# Patient Record
Sex: Female | Born: 1975 | Race: White | Hispanic: No | Marital: Married | Smoking: Never smoker
Health system: Southern US, Community
[De-identification: ages and names within clinical notes are randomized; demographics above are authoritative.]

## PROBLEM LIST (undated history)

## (undated) DIAGNOSIS — L309 Dermatitis, unspecified: Secondary | ICD-10-CM

## (undated) DIAGNOSIS — T7840XA Allergy, unspecified, initial encounter: Secondary | ICD-10-CM

## (undated) DIAGNOSIS — E785 Hyperlipidemia, unspecified: Secondary | ICD-10-CM

## (undated) DIAGNOSIS — R079 Chest pain, unspecified: Secondary | ICD-10-CM

## (undated) DIAGNOSIS — Z789 Other specified health status: Secondary | ICD-10-CM

## (undated) DIAGNOSIS — K219 Gastro-esophageal reflux disease without esophagitis: Secondary | ICD-10-CM

## (undated) HISTORY — DX: Dermatitis, unspecified: L30.9

## (undated) HISTORY — DX: Gastro-esophageal reflux disease without esophagitis: K21.9

## (undated) HISTORY — PX: WISDOM TOOTH EXTRACTION: SHX21

## (undated) HISTORY — PX: OTHER SURGICAL HISTORY: SHX169

## (undated) HISTORY — DX: Allergy, unspecified, initial encounter: T78.40XA

## (undated) HISTORY — DX: Chest pain, unspecified: R07.9

## (undated) HISTORY — DX: Hyperlipidemia, unspecified: E78.5

---

## 2003-10-21 ENCOUNTER — Other Ambulatory Visit: Admission: RE | Admit: 2003-10-21 | Discharge: 2003-10-21 | Payer: Self-pay | Admitting: Family Medicine

## 2004-06-22 ENCOUNTER — Other Ambulatory Visit: Admission: RE | Admit: 2004-06-22 | Discharge: 2004-06-22 | Payer: Self-pay | Admitting: Obstetrics and Gynecology

## 2004-11-13 ENCOUNTER — Other Ambulatory Visit: Admission: RE | Admit: 2004-11-13 | Discharge: 2004-11-13 | Payer: Self-pay | Admitting: Obstetrics and Gynecology

## 2005-11-15 ENCOUNTER — Other Ambulatory Visit: Admission: RE | Admit: 2005-11-15 | Discharge: 2005-11-15 | Payer: Self-pay | Admitting: Obstetrics and Gynecology

## 2006-12-02 ENCOUNTER — Other Ambulatory Visit: Admission: RE | Admit: 2006-12-02 | Discharge: 2006-12-02 | Payer: Self-pay | Admitting: Obstetrics and Gynecology

## 2007-12-24 ENCOUNTER — Inpatient Hospital Stay (HOSPITAL_COMMUNITY): Admission: AD | Admit: 2007-12-24 | Discharge: 2007-12-24 | Payer: Self-pay | Admitting: Obstetrics and Gynecology

## 2008-04-21 ENCOUNTER — Inpatient Hospital Stay (HOSPITAL_COMMUNITY): Admission: AD | Admit: 2008-04-21 | Discharge: 2008-04-21 | Payer: Self-pay | Admitting: Obstetrics and Gynecology

## 2008-07-19 ENCOUNTER — Inpatient Hospital Stay (HOSPITAL_COMMUNITY): Admission: AD | Admit: 2008-07-19 | Discharge: 2008-07-21 | Payer: Self-pay | Admitting: Obstetrics and Gynecology

## 2008-07-22 ENCOUNTER — Encounter: Admission: RE | Admit: 2008-07-22 | Discharge: 2008-08-21 | Payer: Self-pay | Admitting: Obstetrics and Gynecology

## 2008-08-22 ENCOUNTER — Encounter: Admission: RE | Admit: 2008-08-22 | Discharge: 2008-09-18 | Payer: Self-pay | Admitting: Obstetrics and Gynecology

## 2010-10-04 NOTE — L&D Delivery Note (Signed)
Delivery Note  Complete dilation at 0716 Onset of pushing at 0725 FHR second stage 125-130 with doppler  Anesthesia: none  Delivery of a viable female at 50 by CNM  Nuchal Cord x1 tight reduced down body at birth. Cord double clamped after cessation of pulsation, cut by FOB.  Cord blood sample collected. Collection of cord blood donation- not requested.   Placenta delivered shultz intact with 3 VC at 0757.  Placenta to L&D for disposal. Uterine tone firm bleeding scant  no laceration identified.   Est. Blood Loss (mL):39ml  Complications: none  Mom to postpartum.  Baby to Mom.  BAILEY,TANYA 07/08/2011, 8:06 AM

## 2010-11-23 LAB — HIV ANTIBODY (ROUTINE TESTING W REFLEX): HIV: NONREACTIVE

## 2010-11-23 LAB — RUBELLA ANTIBODY, IGM: Rubella: UNDETERMINED

## 2010-11-23 LAB — ABO/RH

## 2011-02-16 NOTE — H&P (Signed)
Haley Guzman, Haley Guzman NO.:  [id number]   MEDICAL RECORD NO.:  [id number]          PATIENT TYPE:  MAT   LOCATION:  [facility]                          FACILITY:  [facility]   PHYSICIAN:  Hal Morales, M.D.DATE OF BIRTH:  1976/07/21   DATE OF ADMISSION:  07/19/2008  DATE OF DISCHARGE:                              HISTORY & PHYSICAL   Dr. Dierdre Forth is the attending physician.   Haley Guzman is a 35 year old gravida 1, para 0 at 40-2/7 weeks who  presented complaining of uterine contractions every 3 minutes since  approximately 5:00 a.m. No leaking or bleeding.  She reports positive  fetal movement.  Pregnancy has been remarkable for:  1. Rh negative.  2. Group B strep negative.  3. Short stature.  4. Rubella equivocal.  5. History of abnormal Pap with colposcopy.  6. First trimester spotting.   PRENATAL LABS:  Blood type is A negative, Rh antibody negative, GTT  nonreactive. Rubella titer is equivocal.  Hepatitis B surface antigen  negative, HIV nonreactive. GC and Chlamydia cultures were negative in  February.  Pap was normal in March. Hemoglobin upon entering the  practice was 12.4. It was within normal limits at 28 weeks.      She had a normal first trimester screening. She declined AFP.  She had  an elevated 1-hour Glucola but had a normal 3-hour GTT. Group B strep  culture was negative at 36 weeks.   HISTORY OF PRESENT PREGNANCY:  The patient entered care at approximately  9 weeks.  She was seen on November 22, 2007 for spotting and has had  none since. She did not receive Rhophylac at that time. Antibody screen  was done at the first visit and was negative. And then she received  Rhophylac at that time. She had an ultrasound at her first visit with  confirmatory dating of July 17, 2008.  First trimester screen was  normal.  She had an ultrasound at 17 weeks showing normal growth and  normal anatomy.  She had some left upper quadrant pain at 22 weeks.  She  had it before pregnancy but had a negative workup and is less frequent  now with pregnancy.  Glucola was given at 26 weeks. It was elevated at  152. Her 3-hour GTT was normal.  She had some Protonix for some reflux.  She had Rhophylac at 28 weeks.  Group B strep culture was negative.  She  had some  hemorrhoid pain at 37-38 weeks that was treated with comfort  measures.  Yesterday she was 1 cm, 90% vertex at minus 2 station.   OBSTETRICAL HISTORY:  The patient is primigravida.   MEDICAL HISTORY:  She is on oral contraceptives until January of 2009.  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  She is a  previous smoker on and off for 5 years but stopped with the onset of  pregnancy.  She had fractured her right ankle at age 1, fractured  collar bone at age 66. Wisdom teeth removed in 1996.   ALLERGIES:  She has no known  medication allergies.   FAMILY HISTORY:  Paternal grandfather had heart disease.  Maternal  grandfather and father have chronic hypertension. Her mother has asthma.  Paternal grandfather had a stroke.  Paternal grandmother had some type  of cancer.  Genetic history is remarkable father of baby's uncle and  first cousin being cousins.  The father of baby has a twin sister.   SOCIAL HISTORY:  The patient is married to the father of baby.  He is  involved and supportive.  His name is Tremeka Helbling.  The patient is  college educated.  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  She has been followed by the certified nurse midwife  service [facility].  She denies any alcohol, drug or tobacco  use during this pregnancy.   PHYSICAL EXAM:  VITAL SIGNS: Stable.  The patient is afebrile.  HEENT: Within normal limits.  LUNGS:  Breath sounds are clear.  HEART:  Regular rate and rhythm without murmur.  BREASTS:  Soft and nontender.  ABDOMEN:  Fundal height is approximately 38 cm.  GENITOURINARY:  Estimated fetal weight 7-8 pounds.   Uterine contractions  every 2 minutes, moderate quality.  Cervix is completely dilated, 100%  effaced, vertex at minus 1 station with bulging bag of water.  Fetal  heart rate is reactive with no decelerations.  EXTREMITIES:  Deep tendon reflexes are 2+ without clonus.  There is  trace edema noted.   IMPRESSION:  1. Intrauterine pregnancy at 40-2/7 weeks.  2. Second stage labor without any urge to push.  3. Reactive fetal heart rate tracing.  4. Negative group B strep.  5. Rhesus blood factor negative.   PLAN:  1. Admit to birthing suite per consult with Dr. Dierdre Forth as      attending physician.  2. Routine certified nurse midwife orders.  3. The patient desires epidural, but I advised her that she this may      not be able to be accomplished due to her advanced labor. May      consider giving a small dose of Stadol for patient comfort.      Renaldo Reel Emilee Hero, C.N.M.      Hal Morales, M.D.  Electronically Signed    VLL/MEDQ  D:  07/19/2008  T:  07/19/2008  Job:  [id number]

## 2011-06-09 LAB — STREP B DNA PROBE: GBS: POSITIVE

## 2011-06-28 LAB — RH IMMUNE GLOBULIN WORKUP (NOT WOMEN'S HOSP)
ABO/RH(D): A NEG
Antibody Screen: NEGATIVE

## 2011-07-02 LAB — RH IMMUNE GLOBULIN WORKUP (NOT WOMEN'S HOSP): Antibody Screen: NEGATIVE

## 2011-07-05 LAB — CBC
HCT: 36.1
Hemoglobin: 8.9 — ABNORMAL LOW
MCHC: 33.1
MCV: 87.7
Platelets: 279
RDW: 14.2
RDW: 14.5
WBC: 13.6 — ABNORMAL HIGH

## 2011-07-05 LAB — RH IMMUNE GLOB WKUP(>/=20WKS)(NOT WOMEN'S HOSP): Fetal Screen: NEGATIVE

## 2011-07-05 LAB — RPR: RPR Ser Ql: NONREACTIVE

## 2011-07-08 ENCOUNTER — Inpatient Hospital Stay (HOSPITAL_COMMUNITY)
Admission: AD | Admit: 2011-07-08 | Discharge: 2011-07-10 | DRG: 775 | Disposition: A | Discharge status: Home / Self Care | Payer: 59 | Source: Ambulatory Visit | Attending: Obstetrics | Admitting: Obstetrics

## 2011-07-08 ENCOUNTER — Encounter (HOSPITAL_COMMUNITY): Payer: Self-pay | Admitting: *Deleted

## 2011-07-08 DIAGNOSIS — D62 Acute posthemorrhagic anemia: Secondary | ICD-10-CM | POA: Diagnosis not present

## 2011-07-08 DIAGNOSIS — O9903 Anemia complicating the puerperium: Secondary | ICD-10-CM | POA: Diagnosis not present

## 2011-07-08 DIAGNOSIS — K644 Residual hemorrhoidal skin tags: Secondary | ICD-10-CM | POA: Diagnosis present

## 2011-07-08 DIAGNOSIS — O878 Other venous complications in the puerperium: Secondary | ICD-10-CM | POA: Diagnosis present

## 2011-07-08 HISTORY — DX: Other specified health status: Z78.9

## 2011-07-08 LAB — CBC
HCT: 37.5 % (ref 36.0–46.0)
Hemoglobin: 12.5 g/dL (ref 12.0–15.0)
MCH: 30.3 pg (ref 26.0–34.0)
MCHC: 33.3 g/dL (ref 30.0–36.0)
MCV: 90.8 fL (ref 78.0–100.0)
Platelets: 260 10*3/uL (ref 150–400)
RBC: 4.13 MIL/uL (ref 3.87–5.11)
RDW: 13.7 % (ref 11.5–15.5)
WBC: 11.4 10*3/uL — ABNORMAL HIGH (ref 4.0–10.5)

## 2011-07-08 LAB — RPR: RPR Ser Ql: NONREACTIVE

## 2011-07-08 MED ORDER — DEXTROSE 5 % IV SOLN: 2.5000 10*6.[IU] | INTRAVENOUS | Status: DC

## 2011-07-08 MED ORDER — BUTORPHANOL TARTRATE 2 MG/ML IJ SOLN
1.0000 mg | Freq: Once | INTRAMUSCULAR | Status: AC
  Administered 2011-07-08: 1 mg via INTRAVENOUS
  Filled 2011-07-08: qty 1

## 2011-07-08 MED ORDER — ACETAMINOPHEN 325 MG PO TABS: 650.0000 mg | ORAL_TABLET | ORAL | Status: DC | PRN

## 2011-07-08 MED ORDER — PENICILLIN G POTASSIUM 5000000 UNITS IJ SOLR: 5.0000 10*6.[IU] | Freq: Once | INTRAVENOUS | Status: DC

## 2011-07-08 MED ORDER — IBUPROFEN 600 MG PO TABS: 600.0000 mg | ORAL_TABLET | Freq: Four times a day (QID) | ORAL | Status: DC | PRN

## 2011-07-08 MED ORDER — BELLADONNA ALKALOIDS-OPIUM 16.2-60 MG RE SUPP
1.0000 | Freq: Once | RECTAL | Status: AC
  Administered 2011-07-08: 1 via RECTAL

## 2011-07-08 MED ORDER — CITRIC ACID-SODIUM CITRATE 334-500 MG/5ML PO SOLN: 30.0000 mL | ORAL | Status: DC | PRN

## 2011-07-08 MED ORDER — BENZOCAINE-MENTHOL 20-0.5 % EX AERO
INHALATION_SPRAY | CUTANEOUS | Status: AC
  Filled 2011-07-08: qty 56

## 2011-07-08 MED ORDER — SODIUM CHLORIDE 0.9 % IV SOLN: 250.0000 mL | INTRAVENOUS | Status: DC

## 2011-07-08 MED ORDER — FLEET ENEMA 7-19 GM/118ML RE ENEM: 1.0000 | ENEMA | RECTAL | Status: DC | PRN

## 2011-07-08 MED ORDER — LANOLIN HYDROUS EX OINT: TOPICAL_OINTMENT | CUTANEOUS | Status: DC | PRN

## 2011-07-08 MED ORDER — ONDANSETRON HCL 4 MG/2ML IJ SOLN: 4.0000 mg | Freq: Four times a day (QID) | INTRAMUSCULAR | Status: DC | PRN

## 2011-07-08 MED ORDER — PENICILLIN G POTASSIUM 5000000 UNITS IJ SOLR
2.5000 10*6.[IU] | INTRAVENOUS | Status: DC
  Filled 2011-07-08 (×2): qty 2.5

## 2011-07-08 MED ORDER — PENICILLIN G POTASSIUM 5000000 UNITS IJ SOLR
2.5000 10*6.[IU] | INTRAMUSCULAR | Status: DC
  Filled 2011-07-08 (×2): qty 2.5

## 2011-07-08 MED ORDER — BENZOCAINE-MENTHOL 20-0.5 % EX AERO
1.0000 "application " | INHALATION_SPRAY | CUTANEOUS | Status: DC | PRN
  Administered 2011-07-08: 1 via TOPICAL

## 2011-07-08 MED ORDER — PENICILLIN G POTASSIUM 5000000 UNITS IJ SOLR
5.0000 10*6.[IU] | Freq: Once | INTRAVENOUS | Status: AC
  Administered 2011-07-08: 5 10*6.[IU] via INTRAVENOUS
  Filled 2011-07-08: qty 5

## 2011-07-08 MED ORDER — IBUPROFEN 600 MG PO TABS
600.0000 mg | ORAL_TABLET | Freq: Four times a day (QID) | ORAL | Status: DC
  Administered 2011-07-08 – 2011-07-10 (×8): 600 mg via ORAL
  Filled 2011-07-08 (×8): qty 1

## 2011-07-08 MED ORDER — DOCUSATE SODIUM 100 MG PO CAPS
100.0000 mg | ORAL_CAPSULE | Freq: Two times a day (BID) | ORAL | Status: DC
  Administered 2011-07-08 – 2011-07-10 (×5): 100 mg via ORAL
  Filled 2011-07-08 (×5): qty 1

## 2011-07-08 MED ORDER — LIDOCAINE HCL (PF) 1 % IJ SOLN: 30.0000 mL | INTRAMUSCULAR | Status: DC | PRN

## 2011-07-08 MED ORDER — LIDOCAINE HCL (PF) 1 % IJ SOLN
30.0000 mL | INTRAMUSCULAR | Status: DC | PRN
  Filled 2011-07-08 (×2): qty 30

## 2011-07-08 MED ORDER — OXYCODONE-ACETAMINOPHEN 5-325 MG PO TABS: 1.0000 | ORAL_TABLET | Freq: Four times a day (QID) | ORAL | Status: DC | PRN

## 2011-07-08 MED ORDER — SODIUM CHLORIDE 0.9 % IJ SOLN: 3.0000 mL | INTRAMUSCULAR | Status: DC | PRN

## 2011-07-08 MED ORDER — HYDROCORTISONE 2.5 % RE CREA
1.0000 "application " | TOPICAL_CREAM | Freq: Two times a day (BID) | RECTAL | Status: DC
  Administered 2011-07-08 – 2011-07-10 (×3): 1 via RECTAL
  Filled 2011-07-08 (×2): qty 28.35

## 2011-07-08 MED ORDER — OXYTOCIN 20 UNITS IN LACTATED RINGERS INFUSION - SIMPLE
125.0000 mL/h | Freq: Once | INTRAVENOUS | Status: AC
  Administered 2011-07-08: 500 mL/h via INTRAVENOUS

## 2011-07-08 MED ORDER — DIPHENHYDRAMINE HCL 25 MG PO CAPS: 25.0000 mg | ORAL_CAPSULE | Freq: Four times a day (QID) | ORAL | Status: DC | PRN

## 2011-07-08 MED ORDER — OXYTOCIN BOLUS FROM INFUSION
500.0000 mL | Freq: Once | INTRAVENOUS | Status: DC
  Filled 2011-07-08: qty 1000
  Filled 2011-07-08: qty 500

## 2011-07-08 MED ORDER — WITCH HAZEL-GLYCERIN EX PADS
1.0000 "application " | MEDICATED_PAD | CUTANEOUS | Status: DC | PRN
  Administered 2011-07-08: 1 via TOPICAL

## 2011-07-08 MED ORDER — LACTATED RINGERS IV SOLN
INTRAVENOUS | Status: DC
  Administered 2011-07-08: 05:00:00 via INTRAVENOUS

## 2011-07-08 MED ORDER — PANTOPRAZOLE SODIUM 40 MG PO TBEC
80.0000 mg | DELAYED_RELEASE_TABLET | Freq: Every day | ORAL | Status: DC
  Administered 2011-07-08: 80 mg via ORAL
  Filled 2011-07-08 (×3): qty 2

## 2011-07-08 MED ORDER — DIBUCAINE 1 % RE OINT: 1.0000 "application " | TOPICAL_OINTMENT | RECTAL | Status: DC | PRN

## 2011-07-08 MED ORDER — OXYCODONE-ACETAMINOPHEN 5-325 MG PO TABS: 2.0000 | ORAL_TABLET | ORAL | Status: DC | PRN

## 2011-07-08 MED ORDER — SODIUM CHLORIDE 0.9 % IJ SOLN: 3.0000 mL | Freq: Two times a day (BID) | INTRAMUSCULAR | Status: DC

## 2011-07-08 MED ORDER — PRENATAL PLUS 27-1 MG PO TABS
1.0000 | ORAL_TABLET | Freq: Every day | ORAL | Status: DC
  Administered 2011-07-08 – 2011-07-10 (×3): 1 via ORAL
  Filled 2011-07-08 (×3): qty 1

## 2011-07-08 MED ORDER — LACTATED RINGERS IV SOLN
500.0000 mL | INTRAVENOUS | Status: DC | PRN
Start: 2011-07-08 — End: 2011-07-08

## 2011-07-08 MED ORDER — SIMETHICONE 80 MG PO CHEW: 80.0000 mg | CHEWABLE_TABLET | ORAL | Status: DC | PRN

## 2011-07-08 NOTE — Progress Notes (Signed)
Haley Guzman notified of pt presenting for labor check.  Notified of VE. Admit orders received.

## 2011-07-08 NOTE — Progress Notes (Signed)
Will call when pt can be transferred to birthing suites.

## 2011-07-08 NOTE — Progress Notes (Signed)
Pt may go to room 169 in .

## 2011-07-08 NOTE — Progress Notes (Signed)
Contractions,  

## 2011-07-08 NOTE — Progress Notes (Signed)
Pt presents to mau for labor check.  efm and toco placed, assessing.  Ctx started around 1am.  Denies leaking or bleeding.

## 2011-07-08 NOTE — H&P (Signed)
Haley Guzman is a 35 y.o. female presenting for onset of labor. Maternal Medical History:  Reason for admission: Reason for admission: contractions.  Contractions: Onset was 3-5 hours ago.   Frequency: regular.   Duration is approximately 3 minutes.   Perceived severity is strong.    Fetal activity: Perceived fetal activity is normal.   Last perceived fetal movement was within the past hour.    Prenatal complications: No bleeding.     OB History    Grav Para Term Preterm Abortions TAB SAB Ect Mult Living   2 1 1  0 0 0 0 0 0 1     Past Medical History  Diagnosis Date  . No pertinent past medical history    Past Surgical History  Procedure Date  . Wisdom tooth extraction    Family History: family history is not on file. Social History:  reports that she has never smoked. She does not have any smokeless tobacco history on file. She reports that she does not drink alcohol or use illicit drugs.  Review of Systems  Constitutional: Negative for fever.  Skin: Negative for rash.    Dilation: 5.5 Effacement (%): 90;100 Station: -1 (BB) Exam by:: Humphrey Rolls, RN Blood pressure 128/86, pulse 93, temperature 98.2 F (36.8 C), resp. rate 18, height 5' (1.524 m), weight 77.111 kg (170 lb). Maternal Exam:  Uterine Assessment: Contraction strength is firm.  Contraction duration is 3 minutes. Contraction frequency is regular.   Abdomen: Patient reports no abdominal tenderness. Estimated fetal weight is 8 pounds.   Fetal presentation: vertex  Introitus: Normal vulva. Normal vagina.  Pelvis: adequate for delivery.      Fetal Exam Fetal Monitor Review: Baseline rate: 135.  Variability: moderate (6-25 bpm).   Pattern: accelerations present.    Fetal State Assessment: Category I - tracings are normal.     Physical Exam  Constitutional: She appears well-developed and well-nourished.  HENT:  Head: Normocephalic.  Neck: Normal range of motion.  Cardiovascular: Normal rate.    Respiratory: Effort normal.  Genitourinary: Vagina normal and uterus normal.  Musculoskeletal: Normal range of motion.  Skin: Skin is warm and dry.  Psychiatric: She has a normal mood and affect.    Prenatal labs: ABO, Rh: A/Negative/-- (02/20 0000) Antibody:  negative Rubella: Equivocal (02/20 0000) RPR:   NR HBsAg: Negative (02/20 0000)  HIV: Non-reactive (02/20 0000)  GBS: Positive (09/05 0000)   Assessment/Plan: Active labor w/ hx precipitous delivery + GBS  1) admit 2) PCN protocol 3) AROM after PPCN dose  Aniza Shor 07/08/2011, 5:56 AM

## 2011-07-08 NOTE — Progress Notes (Signed)
  S: Feeling better since stadol in MAU     Tolerating contractions well - breathing with ctx / no pressure with ctx   O:  Calm and breathing thru ctx        PNC initial dose infused        VS: Blood pressure 113/65, pulse 65, temperature 98.3 F (36.8 C), temperature source Oral, resp. rate 20, height 5' (1.524 m), weight 77.111 kg (170 lb).        FHR : baseline 135 / variability moderate / accels + (10x10 since stadol) / decels none        EFM: Category 1        Toco: contractions every 3 minutes / moderate         Cervix : 8/100%/0 station /BBOW        Membranes: AROM - clear / vtx ROT        EFW 8-10  A: Active labor      P: Expectant management      PCN protocol intrapartum      Rotate maternal position to facilitate fetal rotation & descent      Anticipate SVB     Haley Guzman 07/08/2011, 6:31 AM

## 2011-07-08 NOTE — Progress Notes (Signed)
Pt to room 169 via wheelchair. 

## 2011-07-09 LAB — CBC
HCT: 33.6 % — ABNORMAL LOW (ref 36.0–46.0)
Hemoglobin: 10.7 g/dL — ABNORMAL LOW (ref 12.0–15.0)
MCH: 29.4 pg (ref 26.0–34.0)
MCHC: 31.8 g/dL (ref 30.0–36.0)
MCV: 92.3 fL (ref 78.0–100.0)
Platelets: 198 10*3/uL (ref 150–400)
RBC: 3.64 MIL/uL — ABNORMAL LOW (ref 3.87–5.11)
RDW: 13.9 % (ref 11.5–15.5)
WBC: 12.2 10*3/uL — ABNORMAL HIGH (ref 4.0–10.5)

## 2011-07-09 MED ORDER — RHO D IMMUNE GLOBULIN 1500 UNIT/2ML IJ SOLN
300.0000 ug | Freq: Once | INTRAMUSCULAR | Status: AC
  Administered 2011-07-09: 300 ug via INTRAMUSCULAR
  Filled 2011-07-09: qty 2

## 2011-07-09 NOTE — Progress Notes (Signed)
  PPD # 1  Subjective: Pt reports feeling well/ Pain controlled with prescription NSAID's including ibuprofen (Motrin) Tolerating po/ Voiding without problems/ No n/v Bleeding is light Newborn info:  Information for the patient's newborn:  Roderica, Cathell [[id number]]  female  / circ completed this am by Dr Cousins/ Feeding: breast    Objective:  VS: Blood pressure 109/73, pulse 77, temperature 97.4 F (36.3 C), temperature source Oral, resp. rate 18, height 5' (1.524 m), weight 77.111 kg (170 lb), unknown if currently breastfeeding.    Basename 07/09/11 0550 07/08/11 0530  WBC 12.2* 11.4*  HGB 10.7* 12.5  HCT 33.6* 37.5  PLT 198 260    Blood type: --/--/A NEG (10/05 0550) Rubella: Equivocal (02/20 0000)    Physical Exam:  General: alert, cooperative and no distress CV: Regular rate and rhythm Resp: clear Abdomen: soft, nontender, normal bowel sounds Uterine Fundus: firm, below umbilicus, nontender Perineum: is normal and intact/ Rectum: sm hemorrhoids, approx 1cm, pink and not thrombosed Lochia: minimal Ext: edema trace and Homans sign is negative, no sign of DVT   A/P: PPD # 1/ G2P2 0 0 2 Doing well Continue routine post partum orders Anticipate discharge home in AM

## 2011-07-09 NOTE — Progress Notes (Signed)
UR chart review completed.  

## 2011-07-10 LAB — RH IG WORKUP (INCLUDES ABO/RH)
ABO/RH(D): A NEG
Antibody Screen: NEGATIVE
Fetal Screen: NEGATIVE
Gestational Age(Wks): 39

## 2011-07-10 MED ORDER — IBUPROFEN 600 MG PO TABS: 600.0000 mg | ORAL_TABLET | Freq: Four times a day (QID) | ORAL | Status: AC

## 2011-07-10 MED ORDER — DSS 100 MG PO CAPS: 100.0000 mg | ORAL_CAPSULE | Freq: Two times a day (BID) | ORAL | Status: AC

## 2011-07-10 NOTE — Progress Notes (Signed)
PPD 2 SVD  S:  Reports feeling well but a little sore all over. Slight discomfort from external hemorrhoids and   Uterine cramping when nursing. Anticipating d/c home today.              Tolerating po/ No nausea or vomiting             Bleeding is light             Pain controlled withibuprofen (OTC)             Up ad lib / ambulatory  Newborn breast feeding  / Circumcision completed   O:  A & O x 3, NAD, Pleasant affect             VS: Blood pressure 112/73, pulse 80, temperature 97.5 F (36.4 C), temperature source Oral, resp. rate 18, height 5' (1.524 m), weight 77.111 kg (170 lb), unknown if currently breastfeeding.  LABS: Lab Results  Component Value Date   WBC 12.2* 07/09/2011   HGB 10.7* 07/09/2011   HCT 33.6* 07/09/2011   MCV 92.3 07/09/2011   PLT 198 07/09/2011         Perineum:intact   Lochia: scant rubra, no clots  Extremities: no edema, no calf pain or tenderness  Anus: small, pink external hemorrhoids; no edema or evidence of thrombosis    A: PPD # 2   Doing well - stable status  Breastfeeding independently   External hemorrhoids, non-inflamed and resolving   P:        Routine post partum orders  DC home this AM  DC teaching per [employer] booklet  Continue hemorrhoid topical tx and colace.  Juanetta Beets SCNM BAILEY,TANYA 07/10/2011, 10:15 AM

## 2011-07-10 NOTE — Discharge Summary (Signed)
Obstetric Discharge Summary Reason for Admission: onset of labor Prenatal Procedures: none Intrapartum Procedures: spontaneous vaginal delivery Postpartum Procedures: none Complications-Operative and Postpartum: none Hemoglobin  Date Value Range Status  07/09/2011 10.7* 12.0-15.0 (g/dL) Final     HCT  Date Value Range Status  07/09/2011 33.6* 36.0-46.0 (%) Final    Discharge Diagnoses: Term Pregnancy-delivered / external hemorrhoids / mild acute blood loss anemia - stable  Discharge Information: Date: 07/10/2011 Activity: pelvic rest Diet: routine Medications: PNV, Ibuprofen, Colace and Nexium and Proctocream HC Condition: stable Instructions: refer to practice specific booklet Discharge to: home Follow-up Information    Follow up with Donne Baley. Make an appointment in 6 weeks.   Contact information:   [address] [place] [address] [phone]          Newborn Data: Live born female  Birth Weight: 8 lb 10 oz (3912 g) APGAR: ,   Home with mother.  Haley Guzman 07/10/2011, 10:23 AM

## 2011-07-15 ENCOUNTER — Encounter (HOSPITAL_COMMUNITY): Payer: Self-pay | Admitting: *Deleted

## 2014-03-26 ENCOUNTER — Encounter: Payer: Self-pay | Admitting: *Deleted

## 2014-08-05 ENCOUNTER — Encounter: Payer: Self-pay | Admitting: *Deleted

## 2018-03-10 DIAGNOSIS — Z01419 Encounter for gynecological examination (general) (routine) without abnormal findings: Secondary | ICD-10-CM | POA: Diagnosis not present

## 2018-03-10 DIAGNOSIS — Z130 Encounter for screening for diseases of the blood and blood-forming organs and certain disorders involving the immune mechanism: Secondary | ICD-10-CM | POA: Diagnosis not present

## 2018-03-10 DIAGNOSIS — Z6830 Body mass index (BMI) 30.0-30.9, adult: Secondary | ICD-10-CM | POA: Diagnosis not present

## 2018-03-10 DIAGNOSIS — Z1151 Encounter for screening for human papillomavirus (HPV): Secondary | ICD-10-CM | POA: Diagnosis not present

## 2018-03-10 DIAGNOSIS — Z131 Encounter for screening for diabetes mellitus: Secondary | ICD-10-CM | POA: Diagnosis not present

## 2018-03-10 DIAGNOSIS — Z1231 Encounter for screening mammogram for malignant neoplasm of breast: Secondary | ICD-10-CM | POA: Diagnosis not present

## 2018-03-10 DIAGNOSIS — Z1329 Encounter for screening for other suspected endocrine disorder: Secondary | ICD-10-CM | POA: Diagnosis not present

## 2018-03-28 DIAGNOSIS — M940 Chondrocostal junction syndrome [Tietze]: Secondary | ICD-10-CM | POA: Diagnosis not present

## 2018-03-28 DIAGNOSIS — Z800 Family history of malignant neoplasm of digestive organs: Secondary | ICD-10-CM | POA: Diagnosis not present

## 2018-03-28 DIAGNOSIS — K219 Gastro-esophageal reflux disease without esophagitis: Secondary | ICD-10-CM | POA: Diagnosis not present

## 2018-07-24 ENCOUNTER — Other Ambulatory Visit: Payer: Self-pay | Admitting: Physician Assistant

## 2018-07-24 DIAGNOSIS — K219 Gastro-esophageal reflux disease without esophagitis: Secondary | ICD-10-CM | POA: Diagnosis not present

## 2018-07-24 DIAGNOSIS — Z79899 Other long term (current) drug therapy: Secondary | ICD-10-CM | POA: Diagnosis not present

## 2018-07-24 DIAGNOSIS — R1013 Epigastric pain: Secondary | ICD-10-CM

## 2018-07-24 DIAGNOSIS — Z800 Family history of malignant neoplasm of digestive organs: Secondary | ICD-10-CM

## 2018-08-01 ENCOUNTER — Other Ambulatory Visit: Payer: Self-pay

## 2018-08-04 ENCOUNTER — Ambulatory Visit
Admission: RE | Admit: 2018-08-04 | Discharge: 2018-08-04 | Disposition: A | Discharge status: Home / Self Care | Payer: BLUE CROSS/BLUE SHIELD | Source: Ambulatory Visit | Attending: Physician Assistant | Admitting: Physician Assistant

## 2018-08-04 DIAGNOSIS — Z800 Family history of malignant neoplasm of digestive organs: Secondary | ICD-10-CM

## 2018-08-04 DIAGNOSIS — N2889 Other specified disorders of kidney and ureter: Secondary | ICD-10-CM | POA: Diagnosis not present

## 2018-08-04 DIAGNOSIS — R1013 Epigastric pain: Secondary | ICD-10-CM

## 2018-08-10 DIAGNOSIS — K317 Polyp of stomach and duodenum: Secondary | ICD-10-CM | POA: Diagnosis not present

## 2018-08-10 DIAGNOSIS — K219 Gastro-esophageal reflux disease without esophagitis: Secondary | ICD-10-CM | POA: Diagnosis not present

## 2018-08-10 DIAGNOSIS — K449 Diaphragmatic hernia without obstruction or gangrene: Secondary | ICD-10-CM | POA: Diagnosis not present

## 2018-08-15 DIAGNOSIS — K317 Polyp of stomach and duodenum: Secondary | ICD-10-CM | POA: Diagnosis not present

## 2018-08-21 ENCOUNTER — Other Ambulatory Visit: Payer: Self-pay | Admitting: Physician Assistant

## 2018-08-21 DIAGNOSIS — R935 Abnormal findings on diagnostic imaging of other abdominal regions, including retroperitoneum: Secondary | ICD-10-CM

## 2018-08-21 DIAGNOSIS — K769 Liver disease, unspecified: Secondary | ICD-10-CM

## 2018-08-25 DIAGNOSIS — Z23 Encounter for immunization: Secondary | ICD-10-CM | POA: Diagnosis not present

## 2018-09-05 ENCOUNTER — Ambulatory Visit
Admission: RE | Admit: 2018-09-05 | Discharge: 2018-09-05 | Disposition: A | Discharge status: Home / Self Care | Payer: BLUE CROSS/BLUE SHIELD | Source: Ambulatory Visit | Attending: Physician Assistant | Admitting: Physician Assistant

## 2018-09-05 DIAGNOSIS — D1803 Hemangioma of intra-abdominal structures: Secondary | ICD-10-CM | POA: Diagnosis not present

## 2018-09-05 DIAGNOSIS — K449 Diaphragmatic hernia without obstruction or gangrene: Secondary | ICD-10-CM | POA: Diagnosis not present

## 2018-09-05 DIAGNOSIS — K769 Liver disease, unspecified: Secondary | ICD-10-CM

## 2018-09-05 DIAGNOSIS — R935 Abnormal findings on diagnostic imaging of other abdominal regions, including retroperitoneum: Secondary | ICD-10-CM

## 2018-09-05 MED ORDER — GADOBENATE DIMEGLUMINE 529 MG/ML IV SOLN
15.0000 mL | Freq: Once | INTRAVENOUS | Status: AC | PRN
  Administered 2018-09-05: 15 mL via INTRAVENOUS

## 2018-09-25 DIAGNOSIS — K449 Diaphragmatic hernia without obstruction or gangrene: Secondary | ICD-10-CM | POA: Diagnosis not present

## 2018-09-25 DIAGNOSIS — D1803 Hemangioma of intra-abdominal structures: Secondary | ICD-10-CM | POA: Diagnosis not present

## 2018-09-25 DIAGNOSIS — D131 Benign neoplasm of stomach: Secondary | ICD-10-CM | POA: Diagnosis not present

## 2018-09-25 DIAGNOSIS — K219 Gastro-esophageal reflux disease without esophagitis: Secondary | ICD-10-CM | POA: Diagnosis not present

## 2019-05-11 DIAGNOSIS — Z20828 Contact with and (suspected) exposure to other viral communicable diseases: Secondary | ICD-10-CM | POA: Diagnosis not present

## 2019-08-06 DIAGNOSIS — Z23 Encounter for immunization: Secondary | ICD-10-CM | POA: Diagnosis not present

## 2019-09-18 DIAGNOSIS — Z0000 Encounter for general adult medical examination without abnormal findings: Secondary | ICD-10-CM | POA: Diagnosis not present

## 2019-09-18 DIAGNOSIS — Z1329 Encounter for screening for other suspected endocrine disorder: Secondary | ICD-10-CM | POA: Diagnosis not present

## 2019-09-18 DIAGNOSIS — Z6830 Body mass index (BMI) 30.0-30.9, adult: Secondary | ICD-10-CM | POA: Diagnosis not present

## 2019-09-18 DIAGNOSIS — Z13220 Encounter for screening for lipoid disorders: Secondary | ICD-10-CM | POA: Diagnosis not present

## 2019-09-18 DIAGNOSIS — Z131 Encounter for screening for diabetes mellitus: Secondary | ICD-10-CM | POA: Diagnosis not present

## 2019-09-18 DIAGNOSIS — Z130 Encounter for screening for diseases of the blood and blood-forming organs and certain disorders involving the immune mechanism: Secondary | ICD-10-CM | POA: Diagnosis not present

## 2019-09-18 DIAGNOSIS — Z1231 Encounter for screening mammogram for malignant neoplasm of breast: Secondary | ICD-10-CM | POA: Diagnosis not present

## 2019-09-18 DIAGNOSIS — Z01419 Encounter for gynecological examination (general) (routine) without abnormal findings: Secondary | ICD-10-CM | POA: Diagnosis not present

## 2019-11-30 DIAGNOSIS — H8112 Benign paroxysmal vertigo, left ear: Secondary | ICD-10-CM | POA: Diagnosis not present

## 2019-11-30 DIAGNOSIS — H8110 Benign paroxysmal vertigo, unspecified ear: Secondary | ICD-10-CM | POA: Diagnosis not present

## 2019-12-15 ENCOUNTER — Ambulatory Visit: Payer: BLUE CROSS/BLUE SHIELD | Attending: Internal Medicine

## 2019-12-15 DIAGNOSIS — Z23 Encounter for immunization: Secondary | ICD-10-CM

## 2019-12-15 NOTE — Progress Notes (Signed)
   Covid-19 Vaccination Clinic  Name:  Haley Guzman    MRN: [id number] DOB: 07/11/1976  12/15/2019  [name] was observed post Covid-19 immunization for 15 minutes without incident. She was provided with Vaccine Information Sheet and instruction to access the V-Safe system.   [name] was instructed to call 911 with any severe reactions post vaccine: Marland Kitchen Difficulty breathing  . Swelling of face and throat  . A fast heartbeat  . A bad rash all over body  . Dizziness and weakness   Immunizations Administered    Name Date Dose VIS Date Route   Pfizer COVID-19 Vaccine 12/15/2019  2:26 PM 0.3 mL 09/14/2019 Intramuscular   Manufacturer: [employer]   Lot: [id number]   [place]: [address]-1

## 2020-01-08 ENCOUNTER — Ambulatory Visit: Payer: BLUE CROSS/BLUE SHIELD | Attending: Internal Medicine

## 2020-01-08 DIAGNOSIS — Z23 Encounter for immunization: Secondary | ICD-10-CM

## 2020-01-08 NOTE — Progress Notes (Signed)
   Covid-19 Vaccination Clinic  Name:  Haley Guzman    MRN: [id number] DOB: 1975-10-30  01/08/2020  [name] was observed post Covid-19 immunization for 15 minutes without incident. She was provided with Vaccine Information Sheet and instruction to access the V-Safe system.   [name] was instructed to call 911 with any severe reactions post vaccine: Marland Kitchen Difficulty breathing  . Swelling of face and throat  . A fast heartbeat  . A bad rash all over body  . Dizziness and weakness   Immunizations Administered    Name Date Dose VIS Date Route   Pfizer COVID-19 Vaccine 01/08/2020  4:29 PM 0.3 mL 09/14/2019 Intramuscular   Manufacturer: [employer] [place]   Lot: [id number]   [place]: [id number]

## 2020-02-28 DIAGNOSIS — H5340 Unspecified visual field defects: Secondary | ICD-10-CM | POA: Diagnosis not present

## 2020-04-06 IMAGING — MR MR ABDOMEN WO/W CM
12 of 18 series · 30 of 48 positions shown · IV contrast (multihance)
Comparison: 08/04/2018

CLINICAL DATA: Liver lesions on ultrasound.

EXAM:
MRI ABDOMEN WITHOUT AND WITH CONTRAST
TECHNIQUE: Multiplanar multisequence MR imaging of the abdomen was performed
both before and after the administration of intravenous contrast.
CONTRAST:  15mL MULTIHANCE GADOBENATE DIMEGLUMINE 529 MG/ML IV SOLN

[Series 4: cor haste · coronal · 5.0mm · 0.74mm/px · 2 of 32 slices shown]
[im 1/32]
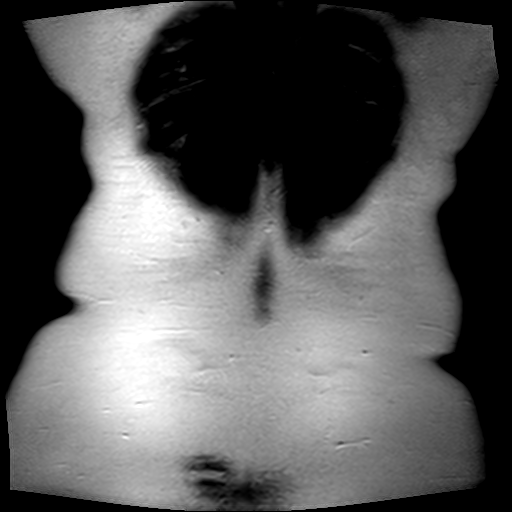
[im 32/32]
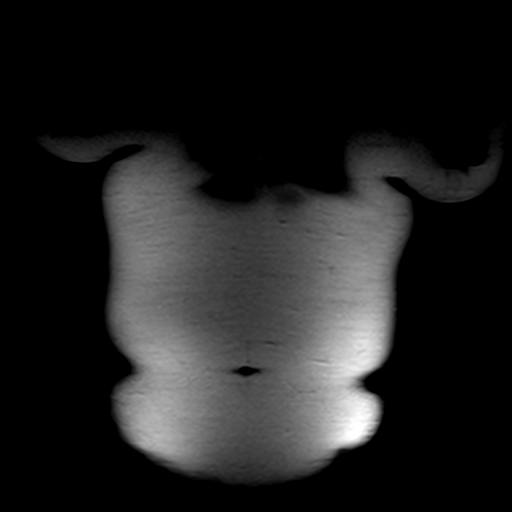

[Series 5: T1 · axial · 6.0mm · 0.74mm/px · z∈[+37,+248]mm · 4 of 66 slices shown]
[im 1/66]
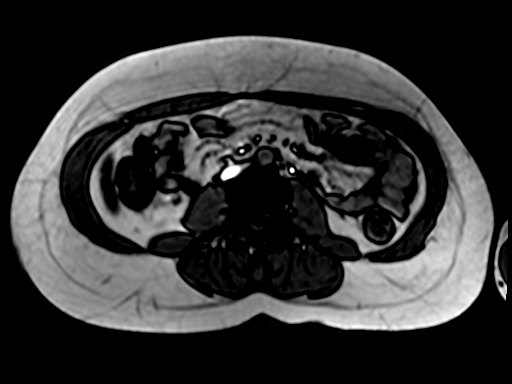
[im 22/66]
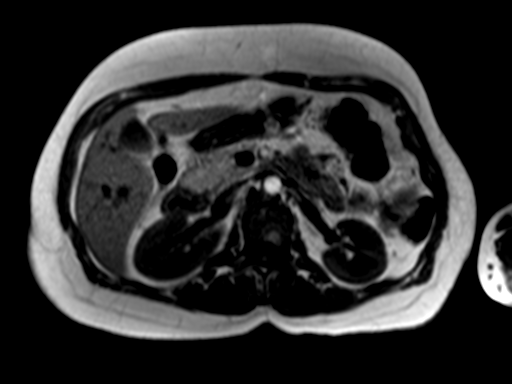
[im 44/66]
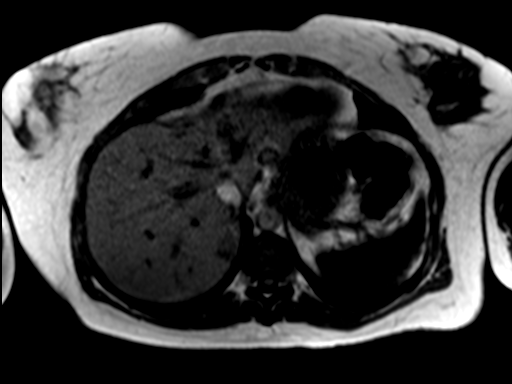
[im 66/66]
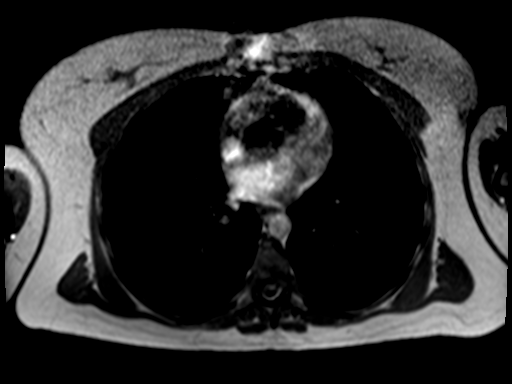

[Series 6: axial haste · axial · 6.0mm · 0.74mm/px · z∈[+28,+252]mm · 2 of 35 slices shown]
[im 1/35]
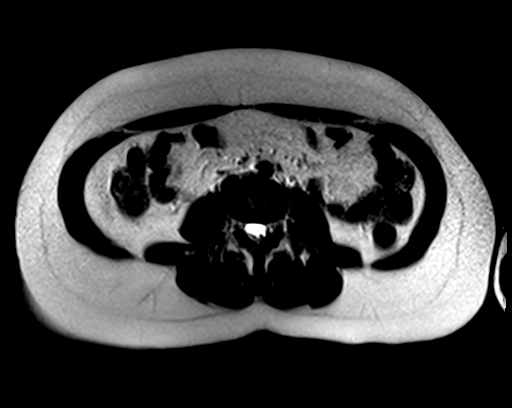
[im 35/35]
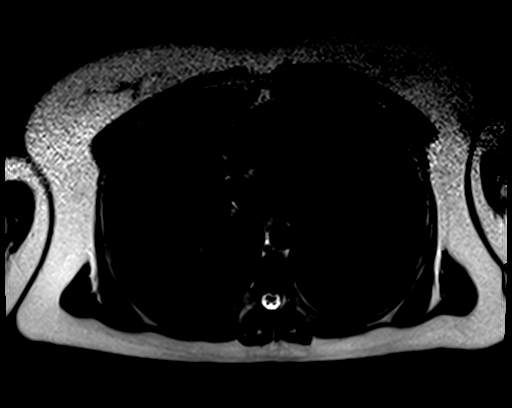

[Series 7: T2 · axial · 6.0mm · 1.12mm/px · z∈[+0,+245]mm · 2 of 35 slices shown (1 of 2)]
[im 1/35]
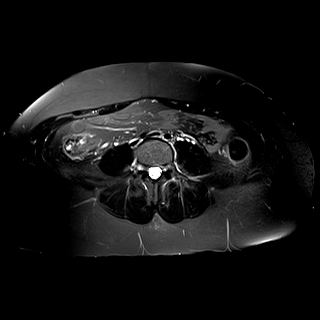
[im 35/35]
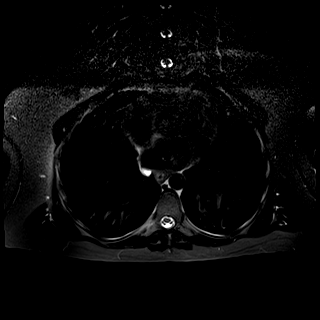

[Series 8: [id number]_trig · axial · 6.0mm · 1.98mm/px · z∈[+0,+245]mm · 4 of 105 slices shown]
[im 1/105]
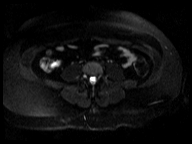
[im 35/105]
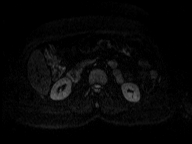
[im 70/105]
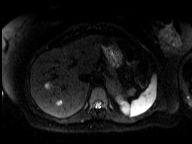
[im 105/105]
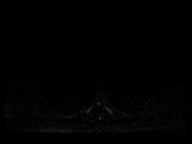

[Series 9: [id number]_trig_adc · axial · 6.0mm · 1.98mm/px · 1 of 35 slices shown]
[im 1/35]
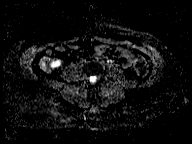

[Series 10: T2 · axial · 6.0mm · 1.12mm/px · 1 of 35 slices shown (2 of 2)]
[im 1/35]
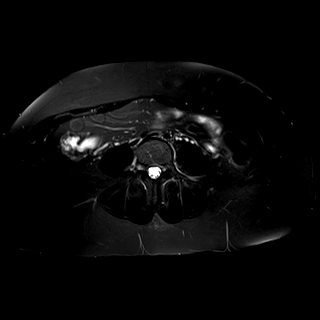

[Series 11: bSSFP · axial · 4.5mm · 0.74mm/px · z∈[+24,+235]mm · 2 of 48 slices shown]
[im 1/48]
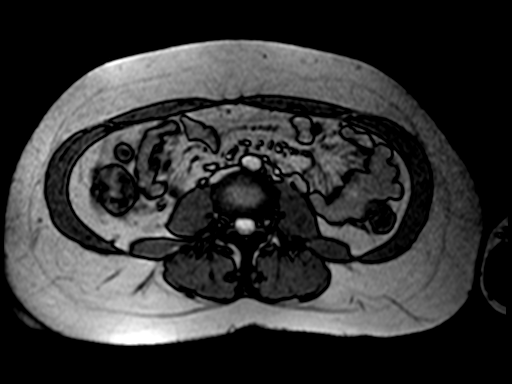
[im 48/48]
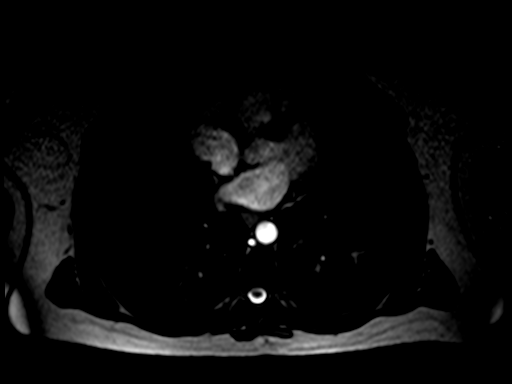

[Series 12: T1 dynamic · axial · non-contrast · 2.5mm · 0.74mm/px · z∈[+21,+238]mm · 3 of 88 slices shown]
[im 1/88]
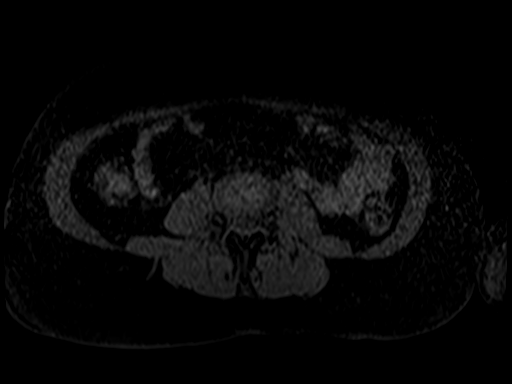
[im 44/88]
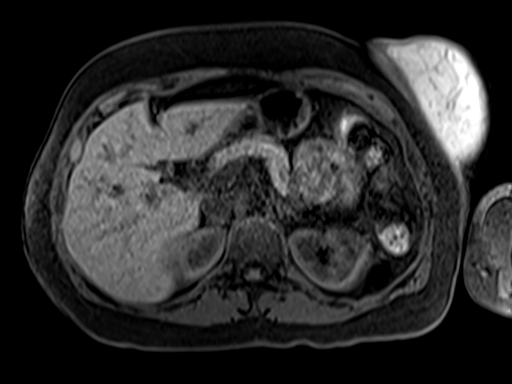
[im 88/88]
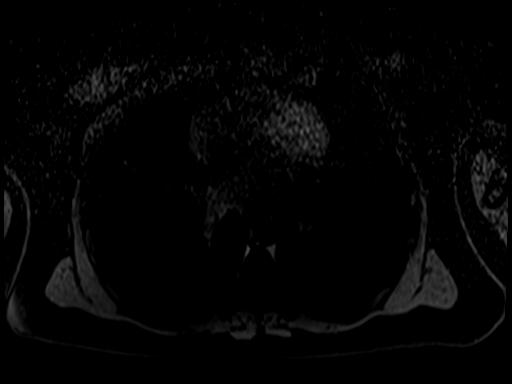

[Series 13: T1 dynamic post-contrast · axial · 2.5mm · 0.74mm/px · z∈[+21,+238]mm · 3 of 88 slices shown (1 of 3)]
[im 1/88]
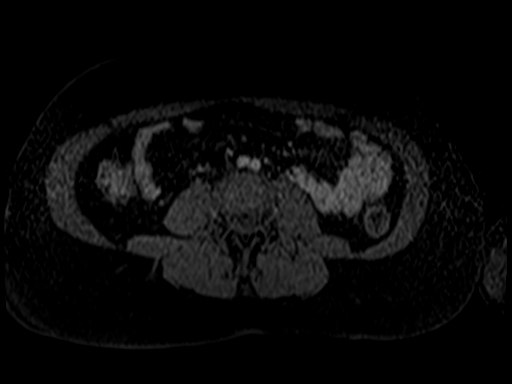
[im 44/88]
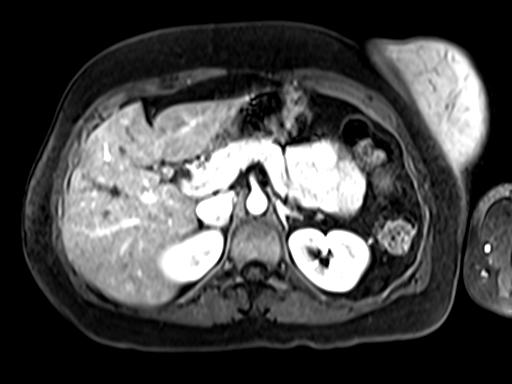
[im 88/88]
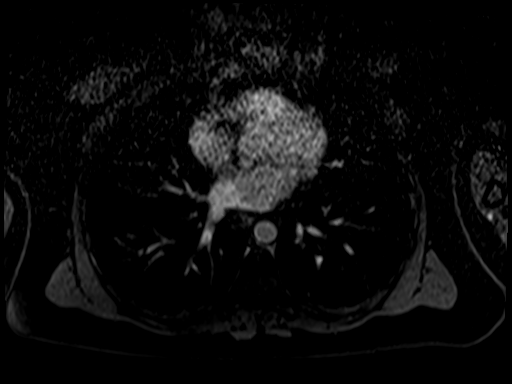

[Series 14: T1 dynamic post-contrast · axial · 2.5mm · 0.74mm/px · z∈[+21,+238]mm · 3 of 88 slices shown (2 of 3)]
[im 1/88]
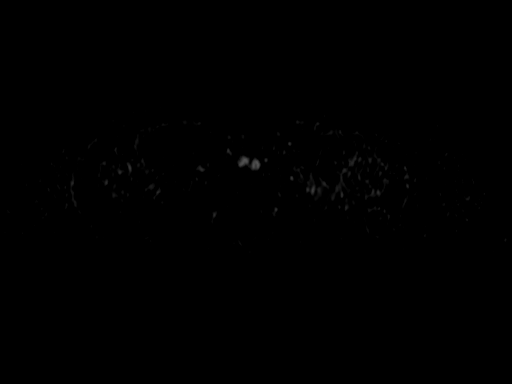
[im 44/88]
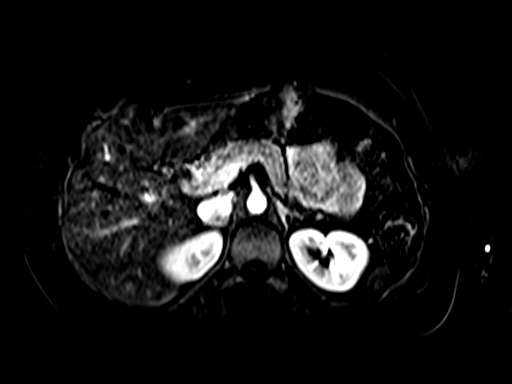
[im 88/88]
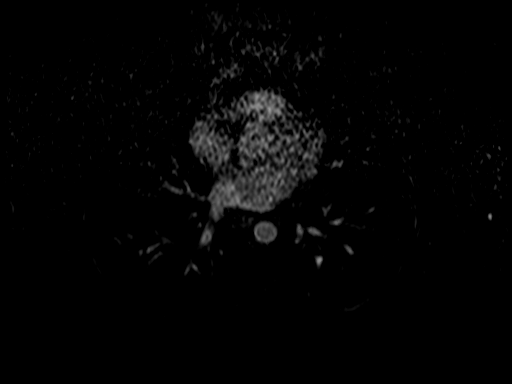

[Series 15: T1 dynamic post-contrast · axial · 2.5mm · 0.74mm/px · z∈[+21,+238]mm · 3 of 88 slices shown (3 of 3)]
[im 1/88]
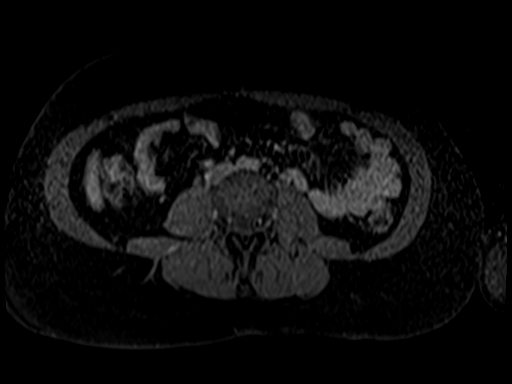
[im 44/88]
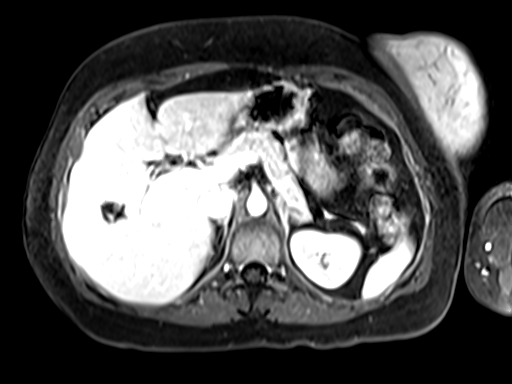
[im 88/88]
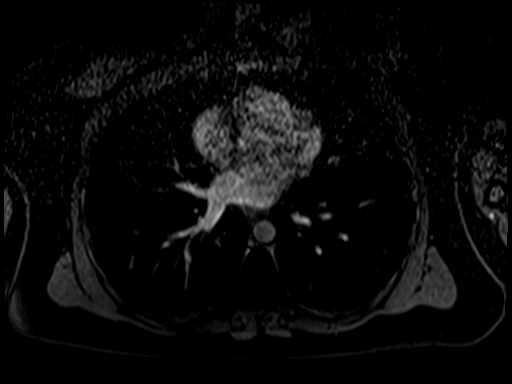

[30 of 48 positions shown; findings below may reference images not displayed]

FINDINGS: Lower chest: Normal heart size without pericardial or pleural
effusion.

Hepatobiliary: No cirrhosis. A total of 4 liver lesions with similar
morphology (marked T2 hyperintensity and postcontrast peripheral
nodular enhancement) identified. Examples at 2.2 cm in segment [DATE]
on image [DATE], 1.3 cm within segment 7 on image [DATE], 1.5 cm in
segment 6 on image [DATE], and 1.9 cm in segment 4 B on image [DATE].

Arterial phase focus of hyperenhancement in the right hepatic lobe
on image 54/13 is likely a perfusion anomaly of approximately 5 mm.

No suspicious liver lesion. Normal gallbladder, without biliary
ductal dilatation.

Pancreas:  Normal, without mass or ductal dilatation.

Spleen:  Normal in size, without focal abnormality.

Adrenals/Urinary Tract: Normal adrenal glands. Normal right kidney.
Interpolar left renal cyst.

Stomach/Bowel: Small hiatal hernia.  Normal abdominal bowel loops.

Vascular/Lymphatic: Normal aortic caliber. No retroperitoneal or
retrocrural adenopathy.

Other:  No ascites.

Musculoskeletal: No acute osseous abnormality.
IMPRESSION: 1. Multiple hepatic hemangiomas.  No suspicious liver lesion.
2. Small hiatal hernia.

## 2020-04-22 DIAGNOSIS — Z03818 Encounter for observation for suspected exposure to other biological agents ruled out: Secondary | ICD-10-CM | POA: Diagnosis not present

## 2020-04-22 DIAGNOSIS — Z20822 Contact with and (suspected) exposure to covid-19: Secondary | ICD-10-CM | POA: Diagnosis not present

## 2020-04-24 DIAGNOSIS — Z03818 Encounter for observation for suspected exposure to other biological agents ruled out: Secondary | ICD-10-CM | POA: Diagnosis not present

## 2020-04-24 DIAGNOSIS — Z20822 Contact with and (suspected) exposure to covid-19: Secondary | ICD-10-CM | POA: Diagnosis not present

## 2020-07-04 DIAGNOSIS — D225 Melanocytic nevi of trunk: Secondary | ICD-10-CM | POA: Diagnosis not present

## 2020-07-04 DIAGNOSIS — L720 Epidermal cyst: Secondary | ICD-10-CM | POA: Diagnosis not present

## 2020-07-04 DIAGNOSIS — L814 Other melanin hyperpigmentation: Secondary | ICD-10-CM | POA: Diagnosis not present

## 2020-07-04 DIAGNOSIS — D2372 Other benign neoplasm of skin of left lower limb, including hip: Secondary | ICD-10-CM | POA: Diagnosis not present

## 2020-09-30 DIAGNOSIS — Z23 Encounter for immunization: Secondary | ICD-10-CM | POA: Diagnosis not present

## 2020-11-21 ENCOUNTER — Other Ambulatory Visit: Payer: Self-pay | Admitting: Pediatrics

## 2020-11-21 ENCOUNTER — Other Ambulatory Visit: Payer: Self-pay | Admitting: Obstetrics and Gynecology

## 2020-11-21 DIAGNOSIS — N6320 Unspecified lump in the left breast, unspecified quadrant: Secondary | ICD-10-CM

## 2020-11-24 ENCOUNTER — Ambulatory Visit
Admission: RE | Admit: 2020-11-24 | Discharge: 2020-11-24 | Disposition: A | Discharge status: Home / Self Care | Payer: BLUE CROSS/BLUE SHIELD | Source: Ambulatory Visit | Attending: Obstetrics and Gynecology | Admitting: Obstetrics and Gynecology

## 2020-11-24 ENCOUNTER — Other Ambulatory Visit: Payer: Self-pay | Admitting: Obstetrics and Gynecology

## 2020-11-24 ENCOUNTER — Ambulatory Visit
Admission: RE | Admit: 2020-11-24 | Discharge: 2020-11-24 | Disposition: A | Discharge status: Home / Self Care | Payer: Managed Care, Other (non HMO) | Source: Ambulatory Visit | Attending: Obstetrics and Gynecology | Admitting: Obstetrics and Gynecology

## 2020-11-24 ENCOUNTER — Other Ambulatory Visit: Payer: Self-pay | Admitting: Diagnostic Radiology

## 2020-11-24 ENCOUNTER — Other Ambulatory Visit: Payer: Self-pay

## 2020-11-24 DIAGNOSIS — N6320 Unspecified lump in the left breast, unspecified quadrant: Secondary | ICD-10-CM

## 2020-11-24 DIAGNOSIS — N61 Mastitis without abscess: Secondary | ICD-10-CM | POA: Diagnosis not present

## 2020-11-26 ENCOUNTER — Other Ambulatory Visit: Payer: BLUE CROSS/BLUE SHIELD

## 2021-12-01 ENCOUNTER — Ambulatory Visit: Payer: Managed Care, Other (non HMO) | Admitting: Family Medicine

## 2021-12-08 ENCOUNTER — Ambulatory Visit: Payer: Managed Care, Other (non HMO) | Admitting: Family Medicine

## 2021-12-08 ENCOUNTER — Encounter: Payer: Self-pay | Admitting: Family Medicine

## 2021-12-08 ENCOUNTER — Other Ambulatory Visit: Payer: Self-pay

## 2021-12-08 VITALS — BP 122/78 | HR 77 | Temp 98.9°F | Ht 65.0 in | Wt 172.0 lb

## 2021-12-08 DIAGNOSIS — Z0000 Encounter for general adult medical examination without abnormal findings: Secondary | ICD-10-CM | POA: Diagnosis not present

## 2021-12-08 DIAGNOSIS — Z1211 Encounter for screening for malignant neoplasm of colon: Secondary | ICD-10-CM

## 2021-12-08 DIAGNOSIS — Z1159 Encounter for screening for other viral diseases: Secondary | ICD-10-CM

## 2021-12-08 LAB — COMPREHENSIVE METABOLIC PANEL
ALT: 13 U/L (ref 0–35)
AST: 15 U/L (ref 0–37)
Albumin: 4.1 g/dL (ref 3.5–5.2)
Alkaline Phosphatase: 53 U/L (ref 39–117)
BUN: 10 mg/dL (ref 6–23)
CO2: 28 mEq/L (ref 19–32)
Calcium: 9.6 mg/dL (ref 8.4–10.5)
Chloride: 104 mEq/L (ref 96–112)
Creatinine, Ser: 0.67 mg/dL (ref 0.40–1.20)
GFR: 104.9 mL/min (ref >60.00)
Glucose, Bld: 85 mg/dL (ref 70–99)
Potassium: 3.7 mEq/L (ref 3.5–5.1)
Sodium: 138 mEq/L (ref 135–145)
Total Bilirubin: 0.5 mg/dL (ref 0.2–1.2)
Total Protein: 7.4 g/dL (ref 6.0–8.3)

## 2021-12-08 LAB — CBC WITH DIFFERENTIAL/PLATELET
Basophils Absolute: 0 10*3/uL (ref 0.0–0.1)
Basophils Relative: 0.4 % (ref 0.0–3.0)
Eosinophils Absolute: 0.1 10*3/uL (ref 0.0–0.7)
Eosinophils Relative: 1.2 % (ref 0.0–5.0)
HCT: 37.9 % (ref 36.0–46.0)
Hemoglobin: 13 g/dL (ref 12.0–15.0)
Lymphocytes Relative: 38.3 % (ref 12.0–46.0)
Lymphs Abs: 2.1 10*3/uL (ref 0.7–4.0)
MCHC: 34.2 g/dL (ref 30.0–36.0)
MCV: 90.1 fl (ref 78.0–100.0)
Monocytes Absolute: 0.3 10*3/uL (ref 0.1–1.0)
Monocytes Relative: 6.2 % (ref 3.0–12.0)
Neutro Abs: 3 10*3/uL (ref 1.4–7.7)
Neutrophils Relative %: 53.9 % (ref 43.0–77.0)
Platelets: 334 10*3/uL (ref 150.0–400.0)
RBC: 4.21 Mil/uL (ref 3.87–5.11)
RDW: 13 % (ref 11.5–15.5)
WBC: 5.5 10*3/uL (ref 4.0–10.5)

## 2021-12-08 LAB — LIPID PANEL
Cholesterol: 210 mg/dL — ABNORMAL HIGH (ref 0–200)
HDL: 46.5 mg/dL (ref >39.00)
LDL Cholesterol: 129 mg/dL — ABNORMAL HIGH (ref 0–99)
NonHDL: 163.57
Total CHOL/HDL Ratio: 5
Triglycerides: 171 mg/dL — ABNORMAL HIGH (ref 0.0–149.0)
VLDL: 34.2 mg/dL (ref 0.0–40.0)

## 2021-12-08 NOTE — Patient Instructions (Signed)
Welcome to [facility]! It was a pleasure meeting you today. ? ?As discussed, Please schedule a 12 month follow up visit today. ? ?We are referring for colonoscopy ?Work on diet/exercise. ? ?PLEASE NOTE: ? ?If you had any LAB tests please let us know if you have not heard back within a few days. You may see your results on MyChart before we have a chance to review them but we will give you a call once they are reviewed by [place]. If we ordered any REFERRALS today, please let us know if you have not heard from their office within the next week.  ?Let us know through MyChart if you are needing REFILLS, or have your pharmacy send [place] the request. You can also use MyChart to communicate with me or any office staff. ? ?Please try these tips to maintain a healthy lifestyle: ? ?Eat most of your calories during the day when you are active. Eliminate processed foods including packaged sweets (pies, cakes, cookies), reduce intake of potatoes, white bread, white pasta, and white rice. Look for whole grain options, oat flour or almond flour. ? ?Each meal should contain half fruits/vegetables, one quarter protein, and one quarter carbs (no bigger than a computer mouse). ? ?Cut down on sweet beverages. This includes juice, soda, and sweet tea. Also watch fruit intake, though this is a healthier sweet option, it still contains natural sugar! Limit to 3 servings daily. ? ?Drink at least 1 glass of water with each meal and aim for at least 8 glasses per day ? ?Exercise at least 150 minutes every week.   ?

## 2021-12-08 NOTE — Addendum Note (Signed)
Addended by: Loura Back on: 12/08/2021 10:06 AM ? ? Modules accepted: Orders ? ?

## 2021-12-08 NOTE — Progress Notes (Signed)
?Phone [phone] ?  ?Subjective:  ? ?Patient is a 46 y.o. female presenting for annual physical.   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 ? ?Pap last yr-gyn-[facility],  has appt next month.  Mamm last yr-has appt next mo.  ? ?Chief Complaint  ?Patient presents with  ? Establish Care  ?  Spots under eyes ?  ? Annual Exam  ?  Fasting ?  ? ? ? ?ROS- ROS: ?Gen: no fever, chills  ?Skin: no rash, itching ?ENT: no ear pain, ear drainage, nasal congestion, rhinorrhea, sinus pressure, sore throat.  Allergies. ?Eyes: no blurry vision, double vision ?Resp: no cough, wheeze,SOB ?Breast: no breast tenderness, no nipple discharge, no breast masses ?CV: no CP, palpitations, LE edema,  ?GI: no heartburn, n/v/d/c, abd pain.  Some chronic GERD-alt pepcid w/Nexium. EGD in past. ?GU: no dysuria, urgency, frequency, hematuria.  No menses w/IUD.  Some hot flashes past 6 mo. ?MSK: no joint pain, myalgias, back pain ?Neuro: no headache, weakness, vertigo.  Occ dizziness-when stands up fast. ?Psych: no depression, anxiety, insomnia, SI .   Moody/irrit-thinks menopause. ? ?The following were reviewed and entered/updated in epic: ?Past Medical History:  ?Diagnosis Date  ? Allergy   ? Chest pain, unspecified   ? Eczema   ? GERD (gastroesophageal reflux disease)   ? Hyperlipidemia   ? No pertinent past medical history   ? ?Patient Active Problem List  ? Diagnosis Date Noted  ? Postpartum care following vaginal delivery (10/4) 07/08/2011  ? ?Past Surgical History:  ?Procedure Laterality Date  ? child birth    ? 07/19/2008, 07/08/2011  ? WISDOM TOOTH EXTRACTION    ? ? ?Family History  ?Problem Relation Age of Onset  ? Hyperlipidemia Mother   ? Hearing loss Mother   ? Arthritis Mother   ? Hypertension Father   ? Hyperlipidemia Father   ? Hypertension Brother   ? Hyperlipidemia Brother   ? Hypertension Maternal Grandmother   ? Hyperlipidemia Maternal Grandmother   ? Stroke Maternal Grandfather   ?  Cancer Paternal Grandmother   ? Diabetes Paternal Grandfather   ? Stroke Paternal Grandfather   ? Heart attack Paternal Grandfather   ? ? ?Medications- reviewed and updated ?Current Outpatient Medications  ?Medication Sig Dispense Refill  ? Calcium Carb-Cholecalciferol (CALCIUM+D3 PO) Take 500 mg by mouth daily. 2 tablets daily    ? cetirizine (ZYRTEC) 10 MG tablet 1 tablet    ? COLLAGEN PO Take 3,300 mg by mouth daily. 2 tablets daily    ? ESOMEPRAZOLE MAGNESIUM PO Take 20 mg by mouth daily before breakfast. 2 tablets daily    ? famotidine (PEPCID) 20 MG tablet Take 20 mg by mouth every other day. 2 tablets every other day    ? Probiotic Product (PROBIOTIC PO) Take by mouth daily.    ? ?No current facility-administered medications for this visit.  ? ? ?Allergies-reviewed and updated ?No Known Allergies ? ?Social History  ? ?Social History Narrative  ? Inventory control  ? ?Objective  ?Objective:  ?BP 122/78   Pulse 77   Temp 98.9 ?F (37.2 ?C) (Temporal)   Ht 5' 5"  (1.651 m)   Wt 172 lb (78 kg)   SpO2 97%   BMI 28.62 kg/m?  ? ?Physical Exam  ?Gen: [facility] NAD [facility] ?HEENT: NCAT, conjunctiva not injected, sclera nonicteric. +xanthaloma B corners eyes ?TM WNL B, OP moist, no exudates  ?NECK:  supple, no thyromegaly, no nodes, no carotid bruits ?  CARDIAC: RRR, S1S2+, no murmur. DP 2+B ?LUNGS: CTAB. No wheezes ?ABDOMEN:  BS+, soft, NTND, No HSM, no masses ?EXT:  no edema ?MSK: no gross abnormalities. MS 5/5 all 4 ?NEURO: A&O x3.  CN II-XII intact.  ?PSYCH: normal mood. Good eye contact  ?  ?Assessment and Plan  ? ?Health Maintenance counseling: ?1. Anticipatory guidance: Patient counseled regarding regular dental exams q6 months, eye exams,  avoiding smoking and second hand smoke, limiting alcohol to 1 beverage per day, no illicit drugs.   ?2. Risk factor reduction:  Advised patient of need for regular exercise and diet rich and fruits and vegetables to reduce risk of heart attack and stroke. Exercise- some.  ?Wt  Readings from Last 3 Encounters:  ?12/08/21 172 lb (78 kg)  ?07/08/11 170 lb (77.1 kg)  ? ?3. Immunizations/screenings/ancillary studies ?Immunization History  ?Administered Date(s) Administered  ? Influenza,inj,Quad PF,6+ Mos 09/06/2016, 08/25/2018  ? Influenza-Unspecified 09/20/2017, 08/06/2019, 08/19/2021  ? PFIZER(Purple Top)SARS-COV-2 Vaccination 12/15/2019, 01/08/2020, 08/24/2020, 07/05/2021  ? Rho (D) Immune Globulin 07/09/2011  ? ?Health Maintenance Due  ?Topic Date Due  ? Hepatitis C Screening  Never done  ? TETANUS/TDAP  Never done  ? PAP SMEAR-Modifier  Never done  ? COLONOSCOPY (Pts 45-18yr Insurance coverage will need to be confirmed)  Never done  ? COVID-19 Vaccine (5 - Booster for Pfizer series) 08/30/2021  ?  ?4. Cervical cancer screening- seeing gyn ?5. Breast cancer screening-  mammogram sch next mo ?6. Colon cancer screening - refer ?7. Skin cancer screening- advised regular sunscreen use. Denies worrisome, changing, or new skin lesions. Has seen Derm ?8. Birth control/STD check- IUD ?9. Osteoporosis screening- n/a ?10. Smoking associated screening - non smoker ? ?Problem List Items Addressed This Visit   ?None ?Visit Diagnoses   ? ? Wellness examination    -  Primary  ? Relevant Orders  ? Comprehensive metabolic panel  ? Lipid panel  ? CBC with Differential/Platelet  ? Hepatitis C antibody  ? Need for hepatitis C screening test      ? Relevant Orders  ? Hepatitis C antibody  ? Screen for colon cancer      ? Relevant Orders  ? Ambulatory referral to Gastroenterology  ? ?  ? Wellness-antic guidance.  Work on [employer]  Getting pap/mamm gyn.  Refer [facility] for colon(pt preference). Check labs ?Xanthaloma-FH HLD-check lipids. Work on [employer]? ?Recommended follow up: annualReturn in about 1 year (around 12/09/2022) for annual. ?No future appointments. ? ?Lab/Order associations:+ fasting ?  ICD-10-CM   ?1. Wellness examination  Z00.00 Comprehensive metabolic panel  ?  Lipid panel  ?  CBC with  Differential/Platelet  ?  Hepatitis C antibody  ?  ?2. Need for hepatitis C screening test  Z11.59 Hepatitis C antibody  ?  ?3. Screen for colon cancer  Z12.11 Ambulatory referral to Gastroenterology  ?  ? ? ?No orders of the defined types were placed in this encounter. ? ? ?AWellington Hampshire MD ? ? ?

## 2022-02-27 LAB — EXTERNAL GENERIC LAB PROCEDURE: COLOGUARD: NEGATIVE

## 2022-06-28 ENCOUNTER — Encounter: Payer: Self-pay | Admitting: *Deleted

## 2022-09-16 ENCOUNTER — Encounter: Payer: Self-pay | Admitting: *Deleted

## 2023-01-13 ENCOUNTER — Other Ambulatory Visit (HOSPITAL_BASED_OUTPATIENT_CLINIC_OR_DEPARTMENT_OTHER): Payer: Self-pay | Admitting: Family Medicine

## 2023-01-13 DIAGNOSIS — E7849 Other hyperlipidemia: Secondary | ICD-10-CM

## 2023-02-15 ENCOUNTER — Ambulatory Visit (HOSPITAL_BASED_OUTPATIENT_CLINIC_OR_DEPARTMENT_OTHER)
Admission: RE | Admit: 2023-02-15 | Discharge: 2023-02-15 | Disposition: A | Discharge status: Home / Self Care | Payer: Managed Care, Other (non HMO) | Source: Ambulatory Visit | Attending: Family Medicine | Admitting: Family Medicine

## 2023-02-15 DIAGNOSIS — E7849 Other hyperlipidemia: Secondary | ICD-10-CM
